# Patient Record
Sex: Female | Born: 1990 | Race: Black or African American | Hispanic: No | Marital: Single | Smoking: Never smoker
Health system: Southern US, Community
[De-identification: ages and names within clinical notes are randomized; demographics above are authoritative.]

## PROBLEM LIST (undated history)

## (undated) DIAGNOSIS — T7840XA Allergy, unspecified, initial encounter: Secondary | ICD-10-CM

## (undated) HISTORY — DX: Allergy, unspecified, initial encounter: T78.40XA

---

## 2010-11-15 ENCOUNTER — Ambulatory Visit: Payer: Self-pay | Admitting: Family Medicine

## 2012-10-21 ENCOUNTER — Encounter (HOSPITAL_COMMUNITY): Payer: Self-pay | Admitting: *Deleted

## 2012-10-21 ENCOUNTER — Emergency Department (HOSPITAL_COMMUNITY)
Admission: EM | Admit: 2012-10-21 | Discharge: 2012-10-21 | Disposition: A | Discharge status: Home / Self Care | Payer: Managed Care, Other (non HMO) | Attending: Emergency Medicine | Admitting: Emergency Medicine

## 2012-10-21 ENCOUNTER — Emergency Department (HOSPITAL_COMMUNITY): Payer: Managed Care, Other (non HMO)

## 2012-10-21 DIAGNOSIS — Y939 Activity, unspecified: Secondary | ICD-10-CM | POA: Insufficient documentation

## 2012-10-21 DIAGNOSIS — M25473 Effusion, unspecified ankle: Secondary | ICD-10-CM | POA: Insufficient documentation

## 2012-10-21 DIAGNOSIS — M25476 Effusion, unspecified foot: Secondary | ICD-10-CM | POA: Insufficient documentation

## 2012-10-21 DIAGNOSIS — X500XXA Overexertion from strenuous movement or load, initial encounter: Secondary | ICD-10-CM | POA: Insufficient documentation

## 2012-10-21 DIAGNOSIS — Y929 Unspecified place or not applicable: Secondary | ICD-10-CM | POA: Insufficient documentation

## 2012-10-21 DIAGNOSIS — S93409A Sprain of unspecified ligament of unspecified ankle, initial encounter: Secondary | ICD-10-CM

## 2012-10-21 DIAGNOSIS — M2550 Pain in unspecified joint: Secondary | ICD-10-CM | POA: Insufficient documentation

## 2012-10-21 DIAGNOSIS — F17200 Nicotine dependence, unspecified, uncomplicated: Secondary | ICD-10-CM | POA: Insufficient documentation

## 2012-10-21 MED ORDER — OXYCODONE-ACETAMINOPHEN 5-325 MG PO TABS
1.0000 | ORAL_TABLET | Freq: Once | ORAL | Status: AC
  Administered 2012-10-21: 1 via ORAL
  Filled 2012-10-21: qty 1

## 2012-10-21 MED ORDER — IBUPROFEN 400 MG PO TABS: 600.0000 mg | ORAL_TABLET | Freq: Once | ORAL | Status: DC

## 2012-10-21 MED ORDER — NAPROXEN 375 MG PO TABS: 375.0000 mg | ORAL_TABLET | Freq: Two times a day (BID) | ORAL | Status: DC

## 2012-10-21 MED ORDER — IBUPROFEN 200 MG PO TABS
400.0000 mg | ORAL_TABLET | Freq: Once | ORAL | Status: AC
  Administered 2012-10-21: 400 mg via ORAL
  Filled 2012-10-21: qty 2

## 2012-10-21 MED ORDER — HYDROCODONE-ACETAMINOPHEN 5-325 MG PO TABS: 1.0000 | ORAL_TABLET | Freq: Once | ORAL | Status: DC

## 2012-10-21 MED ORDER — HYDROCODONE-ACETAMINOPHEN 5-500 MG PO TABS: 1.0000 | ORAL_TABLET | Freq: Four times a day (QID) | ORAL | Status: DC | PRN

## 2012-10-21 NOTE — ED Provider Notes (Signed)
History     CSN: [id number]  Arrival date & time 10/21/12  4401   First MD Initiated Contact with Patient 10/21/12 0224      Chief Complaint  Patient presents with  . Ankle Pain  . Ankle Injury    (Consider location/radiation/quality/duration/timing/severity/associated sxs/prior treatment) Patient is a 21 y.o. female presenting with ankle pain and lower extremity injury. The history is provided by the patient.  Ankle Pain  The incident occurred more than 2 days ago. The incident occurred in the street. Injury mechanism: rolled ankle. The pain is present in the left ankle. The quality of the pain is described as sharp and throbbing. The pain is at a severity of 5/10. The pain is moderate. The pain has been constant since onset. Associated symptoms include inability to bear weight. Pertinent negatives include no numbness and no muscle weakness. She reports no foreign bodies present.  Ankle Injury    History reviewed. No pertinent past medical history.  History reviewed. No pertinent past surgical history.  No family history on file.  History  Substance Use Topics  . Smoking status: Current Some Day Smoker  . Smokeless tobacco: Not on file  . Alcohol Use: No    OB History    Grav Para Term Preterm Abortions TAB SAB Ect Mult Living                  Review of Systems  Musculoskeletal: Positive for joint swelling and arthralgias.  Neurological: Negative for numbness.  All other systems reviewed and are negative.    Allergies  Review of patient's allergies indicates no known allergies.  Home Medications  No current outpatient prescriptions on file.  BP 98/61  Pulse 69  Temp 98.6 F (37 C)  Resp 18  SpO2 98%  LMP 10/16/2012  Physical Exam  Constitutional: She is oriented to person, place, and time. She appears well-developed and well-nourished.  HENT:  Head: Normocephalic and atraumatic.  Eyes: Conjunctivae normal and EOM are normal. Pupils are equal,  round, and reactive to light.  Neck: Normal range of motion.  Cardiovascular: Normal rate, regular rhythm and normal heart sounds.   Pulmonary/Chest: Effort normal and breath sounds normal.  Abdominal: Soft. Bowel sounds are normal.  Musculoskeletal: Normal range of motion. She exhibits edema and tenderness.       Left ankle tenderness to palpation at bilat malleoli  Neurological: She is alert and oriented to person, place, and time.  Skin: Skin is warm and dry.  Psychiatric: She has a normal mood and affect. Her behavior is normal.    ED Course  Procedures (including critical care time)  Labs Reviewed - No data to display Dg Ankle Complete Left  10/21/2012  *RADIOLOGY REPORT*  Clinical Data: Rolled foot playing basketball, ankle pain/injury, pain medially  LEFT ANKLE COMPLETE - 3+ VIEW  Comparison: None  Findings: Diffuse soft tissue swelling. Ankle mortise intact. Osseous mineralization normal. No acute fracture, dislocation or bone destruction.  IMPRESSION: No acute osseous abnormalities.   Original Report Authenticated By: Ulyses Southward, M.D.    Dg Foot Complete Left  10/21/2012  *RADIOLOGY REPORT*  Clinical Data: Rolled foot playing basketball, ankle pain/injury, pain medially  LEFT FOOT - COMPLETE 3+ VIEW  Comparison: None  Findings: Osseous mineralization normal. Joint spaces preserved. No acute fracture, dislocation or bone destruction.  IMPRESSION: No acute osseous abnormalities.   Original Report Authenticated By: Ulyses Southward, M.D.      No diagnosis found.    MDM  Xray neg./  Will ace,  Rice,  Crutches, ortho fu,  Analgesia,  Ret new/worsening sxs        Kaimana Neuzil Lytle Michaels, MD 10/21/12 [phone]

## 2012-10-21 NOTE — ED Notes (Signed)
To xray via w/c.

## 2012-10-21 NOTE — Progress Notes (Signed)
Orthopedic Tech Progress Note Patient Details:  Sheila Rhodes 04/29/1991 [id number]  Ortho Devices Type of Ortho Device: Crutches   [name] 10/21/2012, 3:01 AM

## 2012-10-21 NOTE — ED Notes (Signed)
C/o L ankle pain, rolled ankle while playing basketball, jumped and came down on other's foot, occurred Sunday night, no relief with ice & elevation, took ibuprofen Sunday night. Swelling significant. Bruising significant. Sx getting worse.

## 2012-11-13 ENCOUNTER — Other Ambulatory Visit: Payer: Self-pay

## 2012-11-13 DIAGNOSIS — R569 Unspecified convulsions: Secondary | ICD-10-CM

## 2012-11-18 ENCOUNTER — Ambulatory Visit (INDEPENDENT_AMBULATORY_CARE_PROVIDER_SITE_OTHER): Payer: Private Health Insurance - Indemnity | Admitting: Family Medicine

## 2012-11-18 VITALS — BP 98/63 | HR 72 | Temp 98.3°F | Resp 16 | Ht 68.5 in | Wt 170.0 lb

## 2012-11-18 DIAGNOSIS — N390 Urinary tract infection, site not specified: Secondary | ICD-10-CM

## 2012-11-18 DIAGNOSIS — R300 Dysuria: Secondary | ICD-10-CM

## 2012-11-18 LAB — POCT URINALYSIS DIPSTICK
Glucose, UA: NEGATIVE
Spec Grav, UA: 1.025
Urobilinogen, UA: 0.2

## 2012-11-18 LAB — POCT UA - MICROSCOPIC ONLY

## 2012-11-18 MED ORDER — SULFAMETHOXAZOLE-TRIMETHOPRIM 800-160 MG PO TABS: 1.0000 | ORAL_TABLET | Freq: Two times a day (BID) | ORAL | Status: DC

## 2012-11-18 NOTE — Patient Instructions (Addendum)
Urinary Tract Infection Urinary tract infections (UTIs) can develop anywhere along your urinary tract. Your urinary tract is your body's drainage system for removing wastes and extra water. Your urinary tract includes two kidneys, two ureters, a bladder, and a urethra. Your kidneys are a pair of bean-shaped organs. Each kidney is about the size of your fist. They are located below your ribs, one on each side of your spine. CAUSES Infections are caused by microbes, which are microscopic organisms, including fungi, viruses, and bacteria. These organisms are so small that they can only be seen through a microscope. Bacteria are the microbes that most commonly cause UTIs. SYMPTOMS  Symptoms of UTIs may vary by age and gender of the patient and by the location of the infection. Symptoms in young women typically include a frequent and intense urge to urinate and a painful, burning feeling in the bladder or urethra during urination. Older women and men are more likely to be tired, shaky, and weak and have muscle aches and abdominal pain. A fever may mean the infection is in your kidneys. Other symptoms of a kidney infection include pain in your back or sides below the ribs, nausea, and vomiting. DIAGNOSIS To diagnose a UTI, your caregiver will ask you about your symptoms. Your caregiver also will ask to provide a urine sample. The urine sample will be tested for bacteria and white blood cells. White blood cells are made by your body to help fight infection. TREATMENT  Typically, UTIs can be treated with medication. Because most UTIs are caused by a bacterial infection, they usually can be treated with the use of antibiotics. The choice of antibiotic and length of treatment depend on your symptoms and the type of bacteria causing your infection. HOME CARE INSTRUCTIONS  If you were prescribed antibiotics, take them exactly as your caregiver instructs you. Finish the medication even if you feel better after you  have only taken some of the medication.  Drink enough water and fluids to keep your urine clear or pale yellow.  Avoid caffeine, tea, and carbonated beverages. They tend to irritate your bladder.  Empty your bladder often. Avoid holding urine for long periods of time.  Empty your bladder before and after sexual intercourse.  After a bowel movement, women should cleanse from front to back. Use each tissue only once. SEEK MEDICAL CARE IF:   You have back pain.  You develop a fever.  Your symptoms do not begin to resolve within 3 days. SEEK IMMEDIATE MEDICAL CARE IF:   You have severe back pain or lower abdominal pain.  You develop chills.  You have nausea or vomiting.  You have continued burning or discomfort with urination. MAKE SURE YOU:   Understand these instructions.  Will watch your condition.  Will get help right away if you are not doing well or get worse. Document Released: 08/29/2005 Document Revised: 05/20/2012 Document Reviewed: 12/28/2011 [employer] Patient Information 2013 [employer].  

## 2012-11-18 NOTE — Progress Notes (Signed)
Patient ID: Mayrin Schmuck MRN: [id number], DOB: 23-Jan-1991, 21 y.o. Date of Encounter: 11/18/2012, 6:34 PM  Primary Physician: Pcp Not In System  Chief Complaint:  Chief Complaint  Patient presents with  . Urinary Tract Infection  . Dysuria    HPI: 21 y.o. year old female presents with 10 day history of  Frequency and bad odor. Last UTI was 1 year ago Hematuria x 1 episode LMP:  current No sick contacts, recent antibiotics, or recent travels.   No vaginal discharge, back pain, fever  History reviewed. No pertinent past medical history.   Home Meds: Prior to Admission medications   Medication Sig Start Date End Date Taking? Authorizing Provider  HYDROcodone-acetaminophen (VICODIN) 5-500 MG per tablet Take 1-2 tablets by mouth every 6 (six) hours as needed for pain. 10/21/12   Chionesu Lytle Michaels, MD  naproxen (NAPROSYN) 375 MG tablet Take 1 tablet (375 mg total) by mouth 2 (two) times daily. 10/21/12   Chionesu Lytle Michaels, MD    Allergies: No Known Allergies  History   Social History  . Marital Status: Single    Spouse Name: N/A    Number of Children: N/A  . Years of Education: N/A   Occupational History  . Not on file.   Social History Main Topics  . Smoking status: Never Smoker   . Smokeless tobacco: Not on file  . Alcohol Use: Yes  . Drug Use: No  . Sexually Active: No   Other Topics Concern  . Not on file   Social History Narrative  . No narrative on file     Review of Systems: Constitutional: negative for chills, fever, night sweats or weight changes Cardiovascular: negative for chest pain or palpitations Respiratory: negative for hemoptysis, wheezing, or shortness of breath Abdominal: negative for abdominal pain, nausea, vomiting or diarrhea Dermatological: negative for rash Neurologic: negative for headache   Physical Exam: Blood pressure 98/63, pulse 72, temperature 98.3 F (36.8 C), temperature source Oral, resp. rate 16, height 5' 8.5"  (1.74 m), weight 170 lb (77.111 kg), last menstrual period 10/16/2012., Body mass index is 25.47 kg/(m^2). General: Well developed, well nourished, in no acute distress. Head: Normocephalic, atraumatic, eyes without discharge, sclera non-icteric, nares are congested. Bilateral auditory canals clear, TM's are without perforation, pearly grey with reflective cone of light bilaterally. Serous effusion bilaterally behind TM's. Maxillary sinus TTP. Oral cavity moist, dentition normal. Posterior pharynx with post nasal drip and mild erythema. No peritonsillar abscess or tonsillar exudate. Neck: Supple. No thyromegaly. Full ROM. No lymphadenopathy. Lungs: Coarse breath sounds bilaterally without Clear bilaterally to auscultation without wheezes, rales, or rhonchi. Breathing is unlabored. Heart: RRR with S1 S2. No murmurs, rubs, or gallops appreciated. Abdomen: Soft, non-tender, non-distended with normoactive bowel sounds. No hepatosplenomegaly. No rebound/guarding. No obvious abdominal masses. McBurney's, Rovsing's, Iliopsoas, and table jar all negative. Msk:  Strength and tone normal for age. Extremities: No clubbing or cyanosis. No edema. Neuro: Alert and oriented X 3. Moves all extremities spontaneously. CNII-XII grossly in tact. Psych:  Responds to questions appropriately with a normal affect.   Labs: Results for orders placed in visit on 11/18/12  POCT URINALYSIS DIPSTICK      Component Value Range   Color, UA yellow     Clarity, UA cloudy     Glucose, UA neg     Bilirubin, UA neg     Ketones, UA neg     Spec Grav, UA 1.025     Blood, UA small  pH, UA 5.5     Protein, UA neg     Urobilinogen, UA 0.2     Nitrite, UA neg     Leukocytes, UA large (3+)       ASSESSMENT AND PLAN:  21 y.o. year old female with UTI-  -Tylenol/Motrin prn -Rest/fluids -RTC precautions -RTC 3-5 days if no improvement  Signed, Elvina Sidle, MD 11/18/2012 6:34 PM

## 2012-11-19 ENCOUNTER — Other Ambulatory Visit (HOSPITAL_COMMUNITY): Payer: Self-pay

## 2012-11-22 LAB — URINE CULTURE: Colony Count: >=100000

## 2013-05-07 ENCOUNTER — Ambulatory Visit (INDEPENDENT_AMBULATORY_CARE_PROVIDER_SITE_OTHER): Payer: Private Health Insurance - Indemnity | Admitting: Physician Assistant

## 2013-05-07 VITALS — BP 98/60 | HR 88 | Temp 98.7°F | Resp 16 | Ht 67.5 in | Wt 167.6 lb

## 2013-05-07 DIAGNOSIS — J069 Acute upper respiratory infection, unspecified: Secondary | ICD-10-CM

## 2013-05-07 DIAGNOSIS — N390 Urinary tract infection, site not specified: Secondary | ICD-10-CM

## 2013-05-07 DIAGNOSIS — R8290 Unspecified abnormal findings in urine: Secondary | ICD-10-CM

## 2013-05-07 LAB — POCT UA - MICROSCOPIC ONLY
Crystals, Ur, HPF, POC: NEGATIVE
RBC, urine, microscopic: NEGATIVE
Yeast, UA: NEGATIVE

## 2013-05-07 LAB — POCT URINALYSIS DIPSTICK
Nitrite, UA: POSITIVE
Protein, UA: 30
Urobilinogen, UA: 1

## 2013-05-07 MED ORDER — NITROFURANTOIN MONOHYD MACRO 100 MG PO CAPS: 100.0000 mg | ORAL_CAPSULE | Freq: Two times a day (BID) | ORAL | Status: DC

## 2013-05-07 MED ORDER — IPRATROPIUM BROMIDE 0.03 % NA SOLN: 2.0000 | Freq: Two times a day (BID) | NASAL | Status: DC

## 2013-05-07 NOTE — Progress Notes (Signed)
Subjective:    Patient ID: Barrie Folk, female    DOB: 07-22-1991, 22 y.o.   MRN: [id number]  HPI   Ms. Whobrey is a very pleasant 22 yr old female here with concern for UTI.  Has noted a strong urine odor for the last couple of days which is how her previous UTIs have presented.  No dysuria at this point, but states that has developed later in previous UTIs.  No hematuria.  Some lower abd cramping, but not sure if that may be due to period that should be starting soon.  Denies nausea or vomiting.  No back pain.  Has felt "pretty warm but getting chills" but hasn't taken her temp.  Pt is sexually active with 1 female partner.  No concern for STI.  Has never been testing.  No vaginal sytmptoms at present.    In addition to UTI symptoms, pt began feeling ill last night.  Noted sore throat, nasal congestion, HA, and ear pressure.  No cough or SOB.  Took Mucinex and Nyquil which improved symptoms.     Review of Systems  Constitutional: Positive for fever (subjective) and chills.  HENT: Positive for ear pain, congestion and sore throat.   Respiratory: Negative for cough, shortness of breath and wheezing.   Gastrointestinal: Positive for abdominal pain. Negative for nausea and vomiting.  Genitourinary: Negative for dysuria, urgency, frequency, hematuria and vaginal discharge.  Skin: Negative.   Neurological: Positive for headaches.       Objective:   Physical Exam  Vitals reviewed. Constitutional: She is oriented to person, place, and time. She appears well-developed and well-nourished. No distress.  HENT:  Head: Normocephalic and atraumatic.  Right Ear: Tympanic membrane and ear canal normal.  Left Ear: Tympanic membrane and ear canal normal.  Mouth/Throat: Uvula is midline and mucous membranes are normal. Posterior oropharyngeal erythema present. No oropharyngeal exudate, posterior oropharyngeal edema or tonsillar abscesses.  Eyes: Conjunctivae are normal. No scleral icterus.  Neck: Neck  supple.  Cardiovascular: Normal rate, regular rhythm and normal heart sounds.  Exam reveals no gallop and no friction rub.   No murmur heard. Pulmonary/Chest: Effort normal and breath sounds normal. She has no wheezes. She has no rales.  Abdominal: Soft. Bowel sounds are normal. There is no tenderness.  Lymphadenopathy:    She has no cervical adenopathy.  Neurological: She is alert and oriented to person, place, and time.  Skin: Skin is warm and dry.  Psychiatric: She has a normal mood and affect. Her behavior is normal.     Results for orders placed in visit on 05/07/13  POCT UA - MICROSCOPIC ONLY      Result Value Range   WBC, Ur, HPF, POC tntc     RBC, urine, microscopic neg     Bacteria, U Microscopic 4+     Mucus, UA neg     Epithelial cells, urine per micros 3-5     Crystals, Ur, HPF, POC neg     Casts, Ur, LPF, POC renal tubular     Yeast, UA neg    POCT URINALYSIS DIPSTICK      Result Value Range   Color, UA dark yellow     Clarity, UA cloudy     Glucose, UA neg     Bilirubin, UA neg     Ketones, UA trace     Spec Grav, UA 1.025     Blood, UA trace-lysed     pH, UA 5.5     Protein,  UA 30     Urobilinogen, UA 1.0     Nitrite, UA positive     Leukocytes, UA large (3+)          Assessment & Plan:  UTI (urinary tract infection) - Plan: Urine culture, nitrofurantoin, macrocrystal-monohydrate, (MACROBID) 100 MG capsule  --  22 yr old female with 2-3 days of UTI symptoms.  UA with +nitrites and 3+ leuks.  Urine cx sent.  Will start macrobid x 5 days.  Push fluids.  Discussed RTC precautions.  Will alter therapy if necessary based on culture data.  Cloudy urine - Plan: POCT UA - Microscopic Only, POCT urinalysis dipstick  -- See above  Abnormal urine odor - Plan: POCT UA - Microscopic Only, POCT urinalysis dipstick  -- See above  Viral URI - Plan: ipratropium (ATROVENT) 0.03 % nasal spray  -- Suspect upper resp symptoms are viral in nature.  She is afebrile, lungs  CTA.  Throat with minimal erythema, no exudates.  Supportive care.  Continue Mucinex, Nyquil.  Add Atrovent nasal.  Discussed RTC precautions.

## 2013-05-07 NOTE — Patient Instructions (Addendum)
Begin taking the antibiotic as directed.  Be sure to finish the full course.  Drink plenty of water.  I will let you know when your culture is back and if we need to do anything differently based on that.  I suspect that your other symptoms are due to a cold virus.  Continue using Mucinex and Nyquil to help symptoms.  Also begin using the atrovent nasal spray to help with congestion, ear pressure, and post-nasal drainage.  If any of your symptoms are worsening or not improving, please let me know.   Urinary Tract Infection Urinary tract infections (UTIs) can develop anywhere along your urinary tract. Your urinary tract is your body's drainage system for removing wastes and extra water. Your urinary tract includes two kidneys, two ureters, a bladder, and a urethra. Your kidneys are a pair of bean-shaped organs. Each kidney is about the size of your fist. They are located below your ribs, one on each side of your spine. CAUSES Infections are caused by microbes, which are microscopic organisms, including fungi, viruses, and bacteria. These organisms are so small that they can only be seen through a microscope. Bacteria are the microbes that most commonly cause UTIs. SYMPTOMS  Symptoms of UTIs may vary by age and gender of the patient and by the location of the infection. Symptoms in young women typically include a frequent and intense urge to urinate and a painful, burning feeling in the bladder or urethra during urination. Older women and men are more likely to be tired, shaky, and weak and have muscle aches and abdominal pain. A fever may mean the infection is in your kidneys. Other symptoms of a kidney infection include pain in your back or sides below the ribs, nausea, and vomiting. DIAGNOSIS To diagnose a UTI, your caregiver will ask you about your symptoms. Your caregiver also will ask to provide a urine sample. The urine sample will be tested for bacteria and white blood cells. White blood cells  are made by your body to help fight infection. TREATMENT  Typically, UTIs can be treated with medication. Because most UTIs are caused by a bacterial infection, they usually can be treated with the use of antibiotics. The choice of antibiotic and length of treatment depend on your symptoms and the type of bacteria causing your infection. HOME CARE INSTRUCTIONS  If you were prescribed antibiotics, take them exactly as your caregiver instructs you. Finish the medication even if you feel better after you have only taken some of the medication.  Drink enough water and fluids to keep your urine clear or pale yellow.  Avoid caffeine, tea, and carbonated beverages. They tend to irritate your bladder.  Empty your bladder often. Avoid holding urine for long periods of time.  Empty your bladder before and after sexual intercourse.  After a bowel movement, women should cleanse from front to back. Use each tissue only once. SEEK MEDICAL CARE IF:   You have back pain.  You develop a fever.  Your symptoms do not begin to resolve within 3 days. SEEK IMMEDIATE MEDICAL CARE IF:   You have severe back pain or lower abdominal pain.  You develop chills.  You have nausea or vomiting.  You have continued burning or discomfort with urination. MAKE SURE YOU:   Understand these instructions.  Will watch your condition.  Will get help right away if you are not doing well or get worse. Document Released: 08/29/2005 Document Revised: 05/20/2012 Document Reviewed: 12/28/2011 [employer] Patient Information 2014 [employer]  

## 2013-05-09 LAB — URINE CULTURE

## 2013-05-28 ENCOUNTER — Other Ambulatory Visit: Payer: Self-pay | Admitting: Physician Assistant

## 2013-05-28 ENCOUNTER — Telehealth: Payer: Self-pay

## 2013-05-28 NOTE — Telephone Encounter (Signed)
Called her to advise.  

## 2013-05-28 NOTE — Telephone Encounter (Signed)
She should return to clinic for repeat U/A and culture.

## 2013-05-28 NOTE — Telephone Encounter (Signed)
Pt would like to know if she would be able to receive a refill on the antibiotic that she was prescribed for her UTI, pt still c/o of strong urine odor and urine is still cloudy also when she urinates she does not feel that she has emptyed her bladder completely each time. Best# [phone] Pharmacy: [facility] [place] [facility] [address]

## 2013-05-30 ENCOUNTER — Ambulatory Visit (INDEPENDENT_AMBULATORY_CARE_PROVIDER_SITE_OTHER): Payer: Private Health Insurance - Indemnity | Admitting: Physician Assistant

## 2013-05-30 VITALS — BP 103/68 | HR 78 | Temp 98.0°F | Resp 16 | Ht 68.0 in | Wt 168.0 lb

## 2013-05-30 DIAGNOSIS — B373 Candidiasis of vulva and vagina: Secondary | ICD-10-CM

## 2013-05-30 DIAGNOSIS — R350 Frequency of micturition: Secondary | ICD-10-CM

## 2013-05-30 DIAGNOSIS — N390 Urinary tract infection, site not specified: Secondary | ICD-10-CM

## 2013-05-30 LAB — POCT URINALYSIS DIPSTICK
Blood, UA: NEGATIVE
Nitrite, UA: NEGATIVE
Protein, UA: NEGATIVE
pH, UA: 6.5

## 2013-05-30 LAB — POCT UA - MICROSCOPIC ONLY
Crystals, Ur, HPF, POC: NEGATIVE
Yeast, UA: POSITIVE

## 2013-05-30 MED ORDER — FLUCONAZOLE 150 MG PO TABS: 150.0000 mg | ORAL_TABLET | Freq: Once | ORAL | Status: DC

## 2013-05-30 MED ORDER — SULFAMETHOXAZOLE-TRIMETHOPRIM 800-160 MG PO TABS: 1.0000 | ORAL_TABLET | Freq: Two times a day (BID) | ORAL | Status: DC

## 2013-05-30 NOTE — Progress Notes (Signed)
   [address], [place] [address]   Phone [phone]  Subjective:    Patient ID: Sheila Rhodes, female    DOB: May 04, 1991, 22 y.o.   MRN: [id number]  HPI Pt presents to clinic with dysuria and frequency without urgency -- she had a UTI at the beginning of the month and got symptom free but then yesterday the symptoms returned.  She is sexually active but partner has not changed recently - she is having no vaginal discharge.    OTC meds - none  Review of Systems  Constitutional: Negative for fever and chills.  Gastrointestinal: Negative for nausea.  Genitourinary: Positive for dysuria, urgency and frequency. Negative for vaginal discharge.       Objective:   Physical Exam  Vitals reviewed. Constitutional: She is oriented to person, place, and time. She appears well-developed and well-nourished.  HENT:  Head: Normocephalic and atraumatic.  Right Ear: External ear normal.  Left Ear: External ear normal.  Eyes: Conjunctivae are normal.  Neck: Normal range of motion.  Cardiovascular: Normal rate, regular rhythm and normal heart sounds.   Pulmonary/Chest: Effort normal and breath sounds normal.  Abdominal: Soft. There is no tenderness. There is no CVA tenderness.  Neurological: She is alert and oriented to person, place, and time.  Skin: Skin is warm and dry.  Psychiatric: She has a normal mood and affect. Her behavior is normal. Judgment and thought content normal.       Assessment & Plan:  Urinary frequency - Plan: POCT UA - Microscopic Only, POCT urinalysis dipstick  Urinary tract infection, site not specified - Plan: Urine culture, sulfamethoxazole-trimethoprim (BACTRIM DS) 800-160 MG per tablet  Yeast vaginitis - Plan: fluconazole (DIFLUCAN) 150 MG tablet  I am unsure if the patient is having a separate UTI or she did not get completely better from her last illness.  D/w pt the causes of UTI- urinate after sex, wipe from front to back and urinate when she feels like  she has to go to the bathroom.  Benny Lennert PA-C 05/30/2013 5:38 PM

## 2013-06-02 ENCOUNTER — Telehealth: Payer: Self-pay | Admitting: Radiology

## 2013-06-02 NOTE — Telephone Encounter (Signed)
Patient is having lip swelling with the antibiotic. I advised her to come here or go to ER immediately. Now. To you FYI

## 2013-06-04 ENCOUNTER — Ambulatory Visit (INDEPENDENT_AMBULATORY_CARE_PROVIDER_SITE_OTHER): Payer: Private Health Insurance - Indemnity | Admitting: Physician Assistant

## 2013-06-04 VITALS — BP 138/82 | HR 93 | Temp 98.9°F | Resp 16 | Ht 68.5 in | Wt 171.2 lb

## 2013-06-04 DIAGNOSIS — K130 Diseases of lips: Secondary | ICD-10-CM

## 2013-06-04 MED ORDER — CIPROFLOXACIN HCL 500 MG PO TABS: 500.0000 mg | ORAL_TABLET | Freq: Two times a day (BID) | ORAL | Status: DC

## 2013-06-04 MED ORDER — VALACYCLOVIR HCL 1 G PO TABS: 1000.0000 mg | ORAL_TABLET | Freq: Two times a day (BID) | ORAL | Status: DC

## 2013-06-04 NOTE — Progress Notes (Signed)
  Subjective:    Patient ID: Barrie Folk, female    DOB: 1991/10/19, 22 y.o.   MRN: [id number]  HPI   Ms. Waszak is a very pleasant 22 yr old female who presents with concern for an allergic reaction.  I initially saw the pt about a month ago for UTI which we treated with Macrobid.  About 2 weeks after completing treatment symptoms recurred, and pt was started on TMP/SMX.  Two days after starting this medicine she noted that her lips felt very dry.  This sensation has worsened.  Lips feel slightly swollen.  Now has a lesion of the left lower lip.  The lesion is not painful.  It is not draining.  No hx of HSV.  Denies sores inside mouth.  No other skin involvement.  No fevers or chills.  No SOB.  States UTI symptoms have resolved.  But has only taken a few doses of the medicine.     Review of Systems  Constitutional: Negative for fever and chills.  HENT: Positive for facial swelling (lips). Negative for mouth sores.   Respiratory: Negative.   Cardiovascular: Negative.   Gastrointestinal: Negative.   Genitourinary: Negative for dysuria, urgency and frequency.  Musculoskeletal: Negative.   Skin: Negative for color change and rash.  Neurological: Negative.        Objective:   Physical Exam  Vitals reviewed. Constitutional: She is oriented to person, place, and time. She appears well-developed and well-nourished. No distress.  HENT:  Head: Normocephalic and atraumatic.  Mouth/Throat:    Lips appear just slightly swollen; there is a discolored lesion at the left side of her lower lip; there are no vesicles or pustules; no drainage; no tenderness  Eyes: Conjunctivae are normal. No scleral icterus.  Cardiovascular: Normal rate, regular rhythm and normal heart sounds.   Pulmonary/Chest: Effort normal and breath sounds normal. She has no wheezes. She has no rales.  Neurological: She is alert and oriented to person, place, and time.  Skin: Skin is warm and dry.  Psychiatric: She has a normal  mood and affect. Her behavior is normal.        Assessment & Plan:  Lip lesion - Plan: Herpes culture, rapid, valACYclovir (VALTREX) 1000 MG tablet   Ms. Mannor is a very pleasant 22 yr old female with a lip lesion.  Pt was concerned that this could be a rxn to TMP/SMX.  I am doubtful that that is the cause as there are no other involved areas.  She is afebrile, VSS.  But, to be safe, will stop the TMP/SMX.  Will change her to Cipro to ensure UTI is completely treated as she has only taken a few days of Bactrim.  Etiology of the lip lesion is unclear.  It does not appear herpetic, but I have attempted to obtain a viral culture anyway.  Will try Valtrex BID as well.  Discussed with pt that I am unsure of the cause of her lip lesion but am hopeful that this combination will help, or that the area will spontaneously resolve.  If anything is worsening, not improving, or if she develops new symptoms she should RTC.

## 2013-06-04 NOTE — Patient Instructions (Addendum)
STOP taking the Bactrim/Septra - I think it's less likely that this is causing your symptoms, but just to be safe let's stop it anyway!  Start the valacyclovir twice daily - this will treat a cold sore if that is the cause of your lip lesion  Please let us know if anything is worsening or not improving

## 2013-06-07 ENCOUNTER — Telehealth: Payer: Self-pay

## 2013-06-07 NOTE — Telephone Encounter (Signed)
Pt called back about labs. Notified of them

## 2013-06-12 LAB — HERPES CULTURE, RAPID: Organism ID, Bacteria: NEGATIVE

## 2014-01-06 IMAGING — CR DG ANKLE COMPLETE 3+V*L*
3 series · 3 of 3 positions shown · non-contrast
Comparison: None

CLINICAL DATA: Rolled foot playing basketball, ankle pain/injury,
pain medially

LEFT ANKLE COMPLETE - 3+ VIEW

[x ankle ap left]
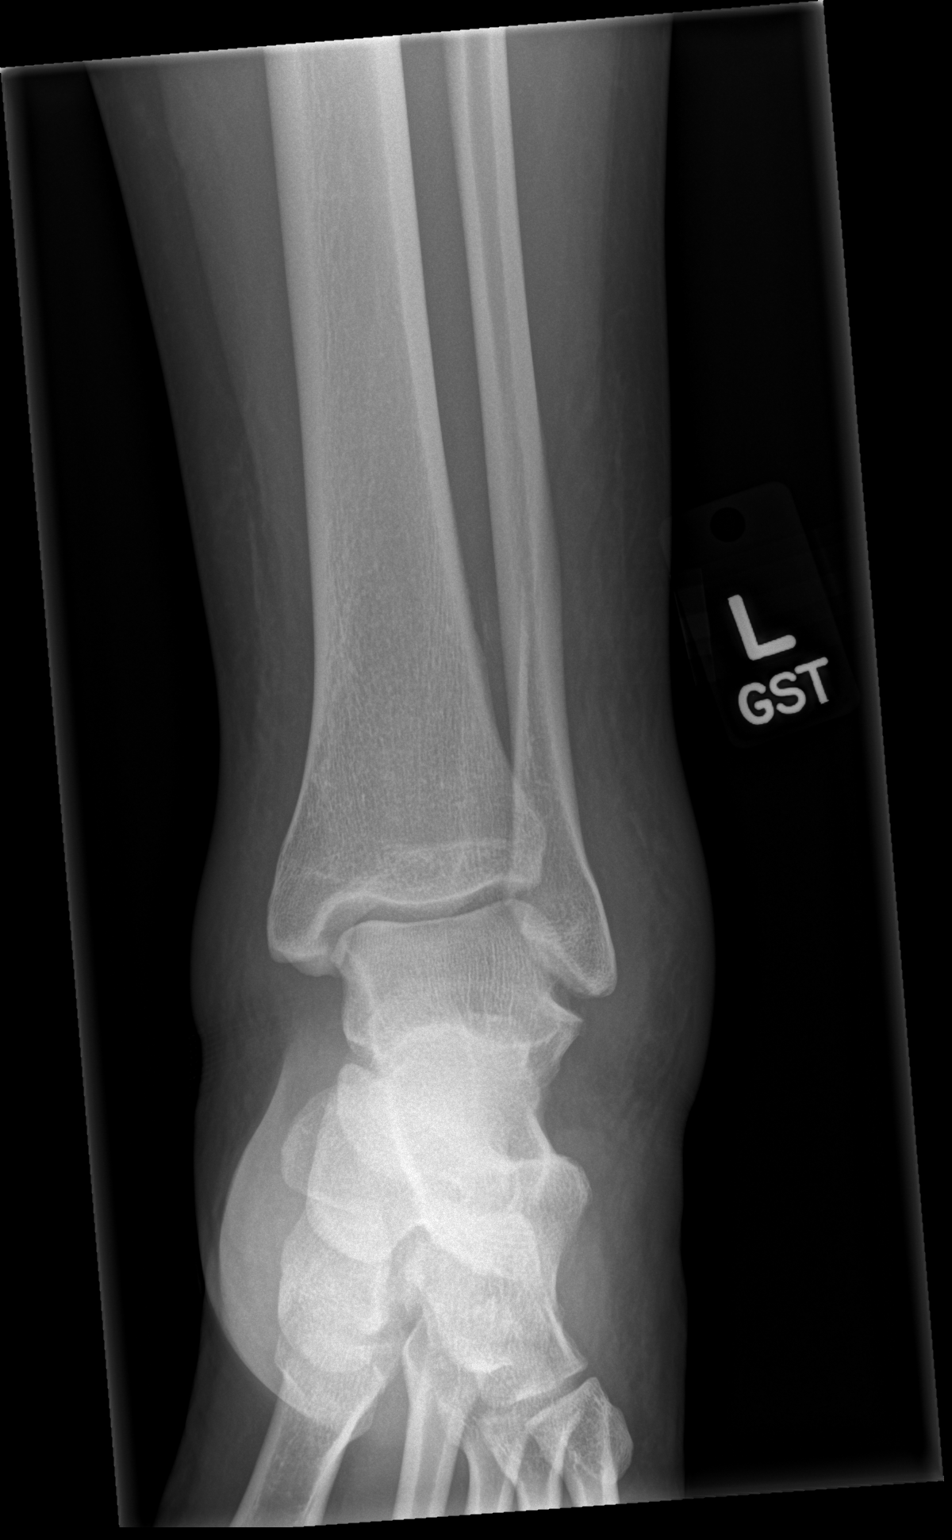

[x ankle obl left]
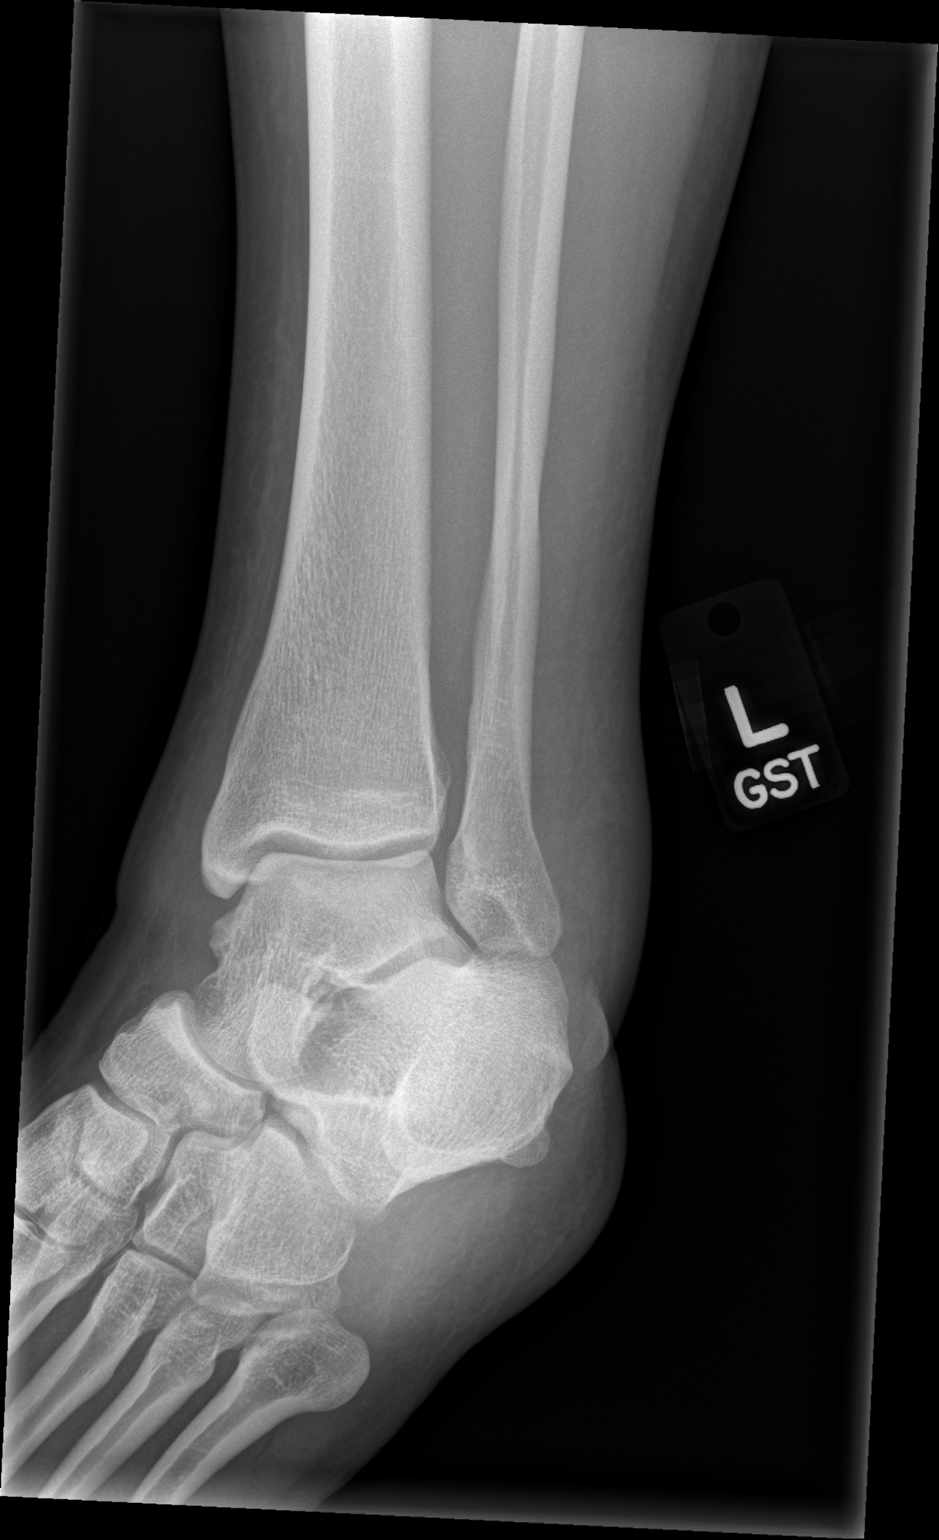

[x ankle lat left]
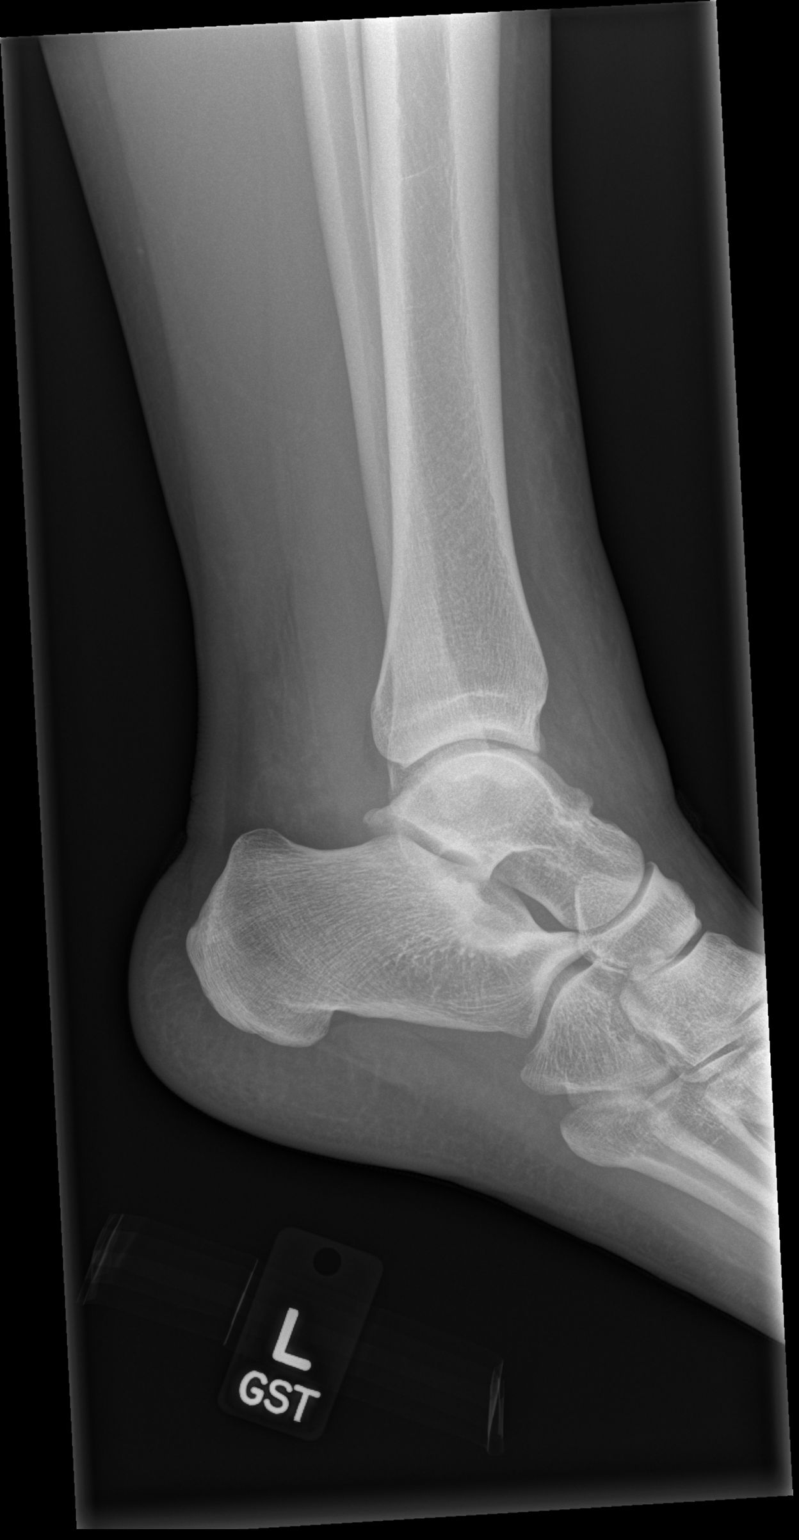

[3 of 3 positions shown; findings below may reference images not displayed]

FINDINGS: Diffuse soft tissue swelling.
Ankle mortise intact.
Osseous mineralization normal.
No acute fracture, dislocation or bone destruction.
IMPRESSION: No acute osseous abnormalities.

## 2014-05-13 ENCOUNTER — Other Ambulatory Visit: Payer: Self-pay | Admitting: Family Medicine

## 2014-05-13 ENCOUNTER — Ambulatory Visit (INDEPENDENT_AMBULATORY_CARE_PROVIDER_SITE_OTHER): Payer: Private Health Insurance - Indemnity | Admitting: Family Medicine

## 2014-05-13 VITALS — BP 124/72 | HR 91 | Temp 98.4°F | Resp 14 | Ht 67.0 in | Wt 182.6 lb

## 2014-05-13 DIAGNOSIS — J988 Other specified respiratory disorders: Secondary | ICD-10-CM

## 2014-05-13 DIAGNOSIS — J22 Unspecified acute lower respiratory infection: Secondary | ICD-10-CM

## 2014-05-13 DIAGNOSIS — R05 Cough: Secondary | ICD-10-CM

## 2014-05-13 DIAGNOSIS — J209 Acute bronchitis, unspecified: Secondary | ICD-10-CM

## 2014-05-13 DIAGNOSIS — R059 Cough, unspecified: Secondary | ICD-10-CM

## 2014-05-13 MED ORDER — FLUCONAZOLE 150 MG PO TABS: 150.0000 mg | ORAL_TABLET | Freq: Once | ORAL | Status: AC

## 2014-05-13 MED ORDER — BENZONATATE 100 MG PO CAPS: 200.0000 mg | ORAL_CAPSULE | Freq: Two times a day (BID) | ORAL | Status: DC | PRN

## 2014-05-13 MED ORDER — AZITHROMYCIN 250 MG PO TABS: ORAL_TABLET | ORAL | Status: DC

## 2014-05-13 MED ORDER — FLUTICASONE PROPIONATE 50 MCG/ACT NA SUSP: 2.0000 | Freq: Every day | NASAL | Status: DC

## 2014-05-13 MED ORDER — HYDROCODONE-HOMATROPINE 5-1.5 MG/5ML PO SYRP: 5.0000 mL | ORAL_SOLUTION | Freq: Every evening | ORAL | Status: AC | PRN

## 2014-05-13 MED ORDER — BENZONATATE 100 MG PO CAPS: 200.0000 mg | ORAL_CAPSULE | Freq: Two times a day (BID) | ORAL | Status: AC | PRN

## 2014-05-13 MED ORDER — AZITHROMYCIN 250 MG PO TABS
ORAL_TABLET | ORAL | Status: AC
Start: 2014-05-13 — End: ?

## 2014-05-13 MED ORDER — FLUCONAZOLE 150 MG PO TABS: 150.0000 mg | ORAL_TABLET | Freq: Once | ORAL | Status: DC

## 2014-05-13 MED ORDER — FLUTICASONE PROPIONATE 50 MCG/ACT NA SUSP
2.0000 | Freq: Every day | NASAL | Status: AC
Start: 2014-05-13 — End: ?

## 2014-05-13 NOTE — Patient Instructions (Signed)
Acute Bronchitis Bronchitis is inflammation of the airways that extend from the windpipe into the lungs (bronchi). The inflammation often causes mucus to develop. This leads to a cough, which is the most common symptom of bronchitis.  In acute bronchitis, the condition usually develops suddenly and goes away over time, usually in a couple weeks. Smoking, allergies, and asthma can make bronchitis worse. Repeated episodes of bronchitis may cause further lung problems.  CAUSES Acute bronchitis is most often caused by the same virus that causes a cold. The virus can spread from person to person (contagious).  SIGNS AND SYMPTOMS   Cough.   Fever.   Coughing up mucus.   Body aches.   Chest congestion.   Chills.   Shortness of breath.   Sore throat.  DIAGNOSIS  Acute bronchitis is usually diagnosed through a physical exam. Tests, such as chest X-rays, are sometimes done to rule out other conditions.  TREATMENT  Acute bronchitis usually goes away in a couple weeks. Often times, no medical treatment is necessary. Medicines are sometimes given for relief of fever or cough. Antibiotics are usually not needed but may be prescribed in certain situations. In some cases, an inhaler may be recommended to help reduce shortness of breath and control the cough. A cool mist vaporizer may also be used to help thin bronchial secretions and make it easier to clear the chest.  HOME CARE INSTRUCTIONS  Get plenty of rest.   Drink enough fluids to keep your urine clear or pale yellow (unless you have a medical condition that requires fluid restriction). Increasing fluids may help thin your secretions and will prevent dehydration.   Only take over-the-counter or prescription medicines as directed by your health care provider.   Avoid smoking and secondhand smoke. Exposure to cigarette smoke or irritating chemicals will make bronchitis worse. If you are a smoker, consider using nicotine gum or skin  patches to help control withdrawal symptoms. Quitting smoking will help your lungs heal faster.   Reduce the chances of another bout of acute bronchitis by washing your hands frequently, avoiding people with cold symptoms, and trying not to touch your hands to your mouth, nose, or eyes.   Follow up with your health care provider as directed.  SEEK MEDICAL CARE IF: Your symptoms do not improve after 1 week of treatment.  SEEK IMMEDIATE MEDICAL CARE IF:  You develop an increased fever or chills.   You have chest pain.   You have severe shortness of breath.  You have bloody sputum.   You develop dehydration.  You develop fainting.  You develop repeated vomiting.  You develop a severe headache. MAKE SURE YOU:   Understand these instructions.  Will watch your condition.  Will get help right away if you are not doing well or get worse. Document Released: 12/27/2004 Document Revised: 07/22/2013 Document Reviewed: 05/12/2013 [employer] Patient Information 2014 [employer].  

## 2014-05-13 NOTE — Progress Notes (Signed)
Chief Complaint:  Chief Complaint  Patient presents with  . URI    fever, chills, runny nose, body aches, back and stomach pain, headache, no energy, cough that is dry now.      HPI: Sheila Rhodes is a 23 y.o. female who is here for 2-3 day history of throat tickling and pain followed by cough , she felt chills and had subjective fevers, she took mucinex and ibuporofen but cough has been persistent , she feels as if she can't breath through her nose. She tried to rest but can't breath through her nose so can't sleep well. She has msk aches all over. She has had HA, she has jaw soreness. No ear pain. No throat pain currently. She feels fatigued. She takes claritin D for allergies. No diarrhea or rashes. No recent travels,  sick contacts. NO CP, SOB, wheezing  Past Medical History  Diagnosis Date  . Allergy    No past surgical history on file. History   Social History  . Marital Status: Single    Spouse Name: N/A    Number of Children: N/A  . Years of Education: N/A   Social History Main Topics  . Smoking status: Never Smoker   . Smokeless tobacco: None  . Alcohol Use: 0.0 oz/week    1-2 Cans of beer per week  . Drug Use: No  . Sexual Activity: Yes    Partners: Female     Comment: 1 partner in last year   Other Topics Concern  . None   Social History Narrative  . None   Family History  Problem Relation Age of Onset  . Stroke Maternal Grandfather    Allergies  Allergen Reactions  . Penicillins Other (See Comments)    Childhood   Prior to Admission medications   Medication Sig Start Date End Date Taking? Authorizing Provider  guaiFENesin (MUCINEX) 600 MG 12 hr tablet Take 600 mg by mouth 2 (two) times daily.   Yes Historical Provider, MD  ibuprofen (ADVIL,MOTRIN) 200 MG tablet Take 200 mg by mouth every 6 (six) hours as needed.   Yes Historical Provider, MD     ROS: The patient denies night sweats, unintentional weight loss, chest pain, palpitations,  wheezing, dyspnea on exertion, nausea, vomiting, abdominal pain, dysuria, hematuria, melena, numbness,  or tingling.   All other systems have been reviewed and were otherwise negative with the exception of those mentioned in the HPI and as above.    PHYSICAL EXAM: Filed Vitals:   05/13/14 2028  BP: 124/72  Pulse: 91  Temp: 98.4 F (36.9 C)  Resp: 14   Filed Vitals:   05/13/14 2028  Height: 5\' 7"  (1.702 m)  Weight: 182 lb 9.6 oz (82.827 kg)   Body mass index is 28.59 kg/(m^2).  General: Alert, no acute distress HEENT:  Normocephalic, atraumatic, oropharynx patent. EOMI, PERRLA, + boggy nares, no sinus tenderness, no exudates Cardiovascular:  Regular rate and rhythm, no rubs murmurs or gallops.  No Carotid bruits, radial pulse intact. No pedal edema.  Respiratory: Clear to auscultation bilaterally.  No wheezes, rales, or rhonchi.  No cyanosis, no use of accessory musculature GI: No organomegaly, abdomen is soft and non-tender, positive bowel sounds.  No masses. Skin: No rashes. Neurologic: Facial musculature symmetric. Psychiatric: Patient is appropriate throughout our interaction. Lymphatic: No cervical lymphadenopathy Musculoskeletal: Gait intact.   LABS: Results for orders placed in visit on 06/04/13  HERPES CULTURE, RAPID      Result  Value Ref Range   Organism ID, Bacteria No Virus Isolated in Cell Culture     Organism ID, Bacteria       Value:                                                                Organism ID, Bacteria       Value:   A negative result does not exclude the possibility of   Organism ID, Bacteria       Value: virus infection;inappropriate specimen collection,storage   Organism ID, Bacteria       Value: and transport may lead to false negative culture results.     EKG/XRAY:   Primary read interpreted by Dr. Conley Rolls at [facility].   ASSESSMENT/PLAN: Encounter Diagnoses  Name Primary?  . Acute bronchitis Yes  . Lower respiratory infection   . Cough     Rx azithromycin Rx hycodan, tessalon, flonase, c/w claritin  Sx treatment first and if no improvement then use z pack  Gross sideeffects, risk and benefits, and alternatives of medications d/w patient. Patient is aware that all medications have potential sideeffects and we are unable to predict every sideeffect or drug-drug interaction that may occur.  Hamilton Capri PHUONG, DO 05/13/2014 9:08 PM
